# Patient Record
Sex: Female | Born: 2000 | Race: White | Hispanic: No | Marital: Single | Smoking: Never smoker
Health system: Southern US, Community
[De-identification: ages and names within clinical notes are randomized; demographics above are authoritative.]

## PROBLEM LIST (undated history)

## (undated) DIAGNOSIS — I7300 Raynaud's syndrome without gangrene: Secondary | ICD-10-CM

## (undated) DIAGNOSIS — K3184 Gastroparesis: Secondary | ICD-10-CM

## (undated) DIAGNOSIS — B019 Varicella without complication: Secondary | ICD-10-CM

## (undated) DIAGNOSIS — K219 Gastro-esophageal reflux disease without esophagitis: Secondary | ICD-10-CM

## (undated) DIAGNOSIS — F419 Anxiety disorder, unspecified: Secondary | ICD-10-CM

## (undated) DIAGNOSIS — N390 Urinary tract infection, site not specified: Secondary | ICD-10-CM

## (undated) HISTORY — DX: Raynaud's syndrome without gangrene: I73.00

## (undated) HISTORY — PX: NO PAST SURGERIES: SHX2092

## (undated) HISTORY — DX: Anxiety disorder, unspecified: F41.9

## (undated) HISTORY — DX: Gastroparesis: K31.84

## (undated) HISTORY — DX: Urinary tract infection, site not specified: N39.0

## (undated) HISTORY — DX: Varicella without complication: B01.9

## (undated) HISTORY — DX: Gastro-esophageal reflux disease without esophagitis: K21.9

---
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
## 2019-05-18 DIAGNOSIS — K3184 Gastroparesis: Secondary | ICD-10-CM | POA: Insufficient documentation

## 2019-12-18 DIAGNOSIS — J029 Acute pharyngitis, unspecified: Secondary | ICD-10-CM | POA: Diagnosis not present

## 2019-12-18 DIAGNOSIS — J039 Acute tonsillitis, unspecified: Secondary | ICD-10-CM | POA: Diagnosis not present

## 2019-12-18 DIAGNOSIS — Z03818 Encounter for observation for suspected exposure to other biological agents ruled out: Secondary | ICD-10-CM | POA: Diagnosis not present

## 2019-12-18 DIAGNOSIS — R05 Cough: Secondary | ICD-10-CM | POA: Diagnosis not present

## 2021-08-10 ENCOUNTER — Encounter: Payer: Self-pay | Admitting: Neurology

## 2021-08-10 ENCOUNTER — Ambulatory Visit (INDEPENDENT_AMBULATORY_CARE_PROVIDER_SITE_OTHER): Payer: No Typology Code available for payment source | Admitting: Neurology

## 2021-08-10 VITALS — BP 115/78 | HR 92 | Ht 64.0 in | Wt 215.0 lb

## 2021-08-10 DIAGNOSIS — Z820 Family history of epilepsy and other diseases of the nervous system: Secondary | ICD-10-CM | POA: Diagnosis not present

## 2021-08-10 DIAGNOSIS — R6889 Other general symptoms and signs: Secondary | ICD-10-CM

## 2021-08-10 DIAGNOSIS — G4719 Other hypersomnia: Secondary | ICD-10-CM

## 2021-08-10 NOTE — Progress Notes (Signed)
Subjective:  ?  ?Patient ID: Rhonda Holt is a 21 y.o. female. ? ?HPI ? ? ? ?Rhonda Foley, Rhonda Holt, Rhonda Holt ?[facility] ?[address], [address] ?[phone] ?[place], [place] [address] ? ?Dear Rhonda Holt, ? ?I saw your patient, Rhonda Holt,  upon your kind request in my sleep clinic today for initial consultation of her sleep disorder, in particular, her excessive daytime somnolence and vivid dreams.  The patient is accompanied by her mother today.  As you know, Rhonda Holt is a 21 year old right-handed woman with an underlying medical history of reflux disease, Raynaud's disease, gastroparesis, anxiety, and obesity, who reports a 4- 5 month history of vivid dreams. She has had trouble losing weight. She works as a Systems analyst. She has experienced daytime sleepiness, for the past 3 months.  She has not been taking her duloxetine and has not taken it in about 7 months.  She does not take her hydroxyzine and is currently not on Flexeril.  She takes Xanax as needed, maybe once a week.  Bedtime is generally between 10 and 11 PM and rise time around 6 AM on workdays and on the weekends she may sleep till 1 PM.  She reports a family history of sleep apnea, maternal grandmother has sleep apnea and mom has sleep apnea.  Mom also reports a diagnosis of narcolepsy. I reviewed your office note from 06/05/2021.  Her Epworth sleepiness score is 4 out of 24, fatigue severity score is 45 out of 63.  She has not fallen asleep at the wheel.  She feels tired, she has felt tired at work but has not fallen asleep at work.  She reports vivid dreams and being confused between reality and dreams.  She denies telltale symptoms of cataplexy or sleep paralysis, no hypnagogic or hypnopompic hallucinations.  She does not drink any caffeine currently.  She drinks alcohol rarely.  She smokes marijuana rarely, she does not smoke any cigarettes.  She had blood work through your office, results are not available for my review today.   She had a telemedicine visit with a sleep specialist on 06/04/2021 and I reviewed the note.  She was advised to proceed with a home sleep test.  She did not have it done.   ?She denies snoring, recurrent morning headaches or nocturia or night to night basis. ? ?Her Past Medical History Is Significant For: ?Past Medical History:  ?Diagnosis Date  ? Acid reflux   ? Anxiety   ? Gastroparesis   ? Raynaud's disease without gangrene   ? UTI (urinary tract infection)   ? Varicella   ? ? ?Her Past Surgical History Is Significant For: ?Past Surgical History:  ?Procedure Laterality Date  ? NO PAST SURGERIES    ? ? ?Her Family History Is Significant For: ?Family History  ?Problem Relation Age of Onset  ? Thyroid cancer Mother   ? Kidney cancer Mother   ? Migraines Mother   ? Supraventricular tachycardia Mother   ?     ablation  ? Sleep apnea Mother   ? Other Father   ?     allergies penicillin and sulfa  ? Diabetes Mellitus II Maternal Grandmother   ? Supraventricular tachycardia Maternal Grandmother   ?     ablation  ? Sleep apnea Maternal Grandmother   ? Diabetes Mellitus II Paternal Grandmother   ? Heart attack Paternal Grandmother   ? ? ?Her Social History Is Significant For: ?Social History  ? ?Socioeconomic History  ?  Marital status: Single  ?  Spouse name: Not on file  ? Number of children: 0  ? Years of education: 12+  ? Highest education level: High school graduate  ?Occupational History  ? Occupation: student  ?Tobacco Use  ? Smoking status: Never  ? Smokeless tobacco: Never  ?Vaping Use  ? Vaping Use: Never used  ?Substance and Sexual Activity  ? Alcohol use: Yes  ?  Comment: OCC  ? Drug use: Yes  ?  Types: Marijuana  ? Sexual activity: Not on file  ?Other Topics Concern  ? Not on file  ?Social History Narrative  ? Lives at home with mom, dad, and sister  ? Right handed  ? Caffeine: trying to avoid, once a month  ? ?Social Determinants of Health  ? ?Financial Resource Strain: Not on file  ?Food Insecurity: Not on file   ?Transportation Needs: Not on file  ?Physical Activity: Not on file  ?Stress: Not on file  ?Social Connections: Not on file  ? ? ?Her Allergies Are:  ?Allergies  ?Allergen Reactions  ? Charcoal   ?  Facial products containing charcoal. Face turns "neon red" for several hours.   ?:  ? ?Her Current Medications Are:  ?Outpatient Encounter Medications as of 08/10/2021  ?Medication Sig  ? JUNEL 1/20 1-20 MG-MCG tablet Take 1 tablet by mouth daily.   ? cyclobenzaprine (FLEXERIL) 10 MG tablet Take 1 tablet (10 mg total) by mouth 3 (three) times daily as needed for muscle spasms.  ? hydrOXYzine (ATARAX/VISTARIL) 50 MG tablet Take one 50 mg tablet at night before bed  ? promethazine (PHENERGAN) 12.5 MG tablet Take 12.5 mg by mouth every 6 (six) hours as needed for nausea.   ? ?No facility-administered encounter medications on file as of 08/10/2021.  ?: ? ? ?Review of Systems:  ?Out of a complete 14 point review of systems, all are reviewed and negative with the exception of these symptoms as listed below: ? ?Review of Systems  ?Neurological:   ?     Pt is here for sleep consult  pt states she has fatigue. Pt states that she has very vivid dreams and she wakes up confused Pt denies hypertension,snore, headaches, CPAP machine ,and sleep study  ? ?ESS:4 ?FSS:45  ? ?Objective:  ?Neurological Exam ? ?Physical Exam ?Physical Examination:  ? ?Vitals:  ? 08/10/21 1335  ?BP: 115/78  ?Pulse: 92  ? ? ?General Examination: The patient is a very pleasant 21 y.o. female in no acute distress. She appears well-developed and well-nourished and well groomed.  ? ?HEENT: Normocephalic, atraumatic, pupils are equal, round and reactive to light, extraocular tracking is good without limitation to gaze excursion or nystagmus noted. Hearing is grossly intact. Face is symmetric with normal facial animation. Speech is clear with no dysarthria noted. There is no hypophonia. There is no lip, neck/head, jaw or voice tremor. Neck is supple with full range of  passive and active motion. There are no carotid bruits on auscultation. Oropharynx exam reveals: mild mouth dryness, good dental hygiene and moderate airway crowding, due to small airway entry, tonsillar size of about 1-2+.  Mallampati class I.  Neck circumference of 14-5/8 inches.  Tongue protrudes centrally and palate elevates symmetrically. ? ?Chest: Clear to auscultation without wheezing, rhonchi or crackles noted. ? ?Heart: S1+S2+0, regular and normal without murmurs, rubs or gallops noted.  ? ?Abdomen: Soft, non-tender and non-distended with normal bowel sounds appreciated on auscultation. ? ?Extremities: There is no obvious edema.  ? ?  Skin: Warm and dry without trophic changes noted.  ? ?Musculoskeletal: exam reveals no obvious joint deformities.  ? ?Neurologically:  ?Mental status: The patient is awake, alert and oriented in all 4 spheres. Her immediate and remote memory, attention, language skills and fund of knowledge are appropriate. There is no evidence of aphasia, agnosia, apraxia or anomia. Speech is clear with normal prosody and enunciation. Thought process is linear. Mood is normal and affect is normal.  ?Cranial nerves II - XII are as described above under HEENT exam.  ?Motor exam: Normal bulk, strength and tone is noted. There is no obvious tremor. Fine motor skills and coordination: grossly intact.  ?Cerebellar testing: No dysmetria or intention tremor. There is no truncal or gait ataxia.  ?Sensory exam: intact to light touch in the upper and lower extremities.  ?Gait, station and balance: She stands easily. No veering to one side is noted. No leaning to one side is noted. Posture is age-appropriate and stance is narrow based. Gait shows normal stride length and normal pace. No problems turning are noted.  ? ?Assessment and Plan:  ?In summary, Jorden A Boyajian is a very pleasant 21 y.o.-year old female with an underlying medical history of reflux disease, Raynaud's disease, gastroparesis, anxiety,  and obesity, who who presents for evaluation of her sleep disturbance including vivid dreams and daytime somnolence of 4 to 5 months duration.  Her history is not compelling for sleep disordered breath

## 2021-08-10 NOTE — Patient Instructions (Signed)
Thank you for choosing [facility] for your sleep related care! ?It was nice to meet you today! I appreciate that you entrust me with your sleep related healthcare concerns. I hope, I was able to address at least some of your concerns today, and that I can help you feel reassured and also get better.   ? ?Here is what we discussed today and what we came up with as our plan for you:  ?  ?Based on your symptoms and your exam I believe you may have an underlying sleepiness condition. We will look into your sleepiness and vivid dreams with a nighttime sleep study, followed by a daytime nap study. In preparation for sleep study testing:  ?Do not take any narcotic pain medication and do not start any new depression or anxiety medication or stimulant.  In particular, you would have to stay off your Xanax, Flexeril, duloxetine and hydroxyzine.  You are currently not taking any of these medications daily.  Eventually, for your anxiety, you may benefit from medication management, please talk to your primary care about this issue. ?Do no drive when sleepy!  ?Please keep your sleep schedule stable and do not add any additional psychotropic medications or excess caffeine in your day to day routine, in preparation of the study.  ?4.   Our sleep lab administrative assistant will call you to schedule your sleep study. If you don't hear back from her by about 2 weeks from now, please feel free to call her at [phone]. You can leave a message with your phone number and concerns, if you get the voicemail box. She will call back as soon as possible. ?  ?The overnight sleep study will help determine whether you do or do not have OSA and how severe it is. Even, if you have mild OSA, I may want you to consider treatment with CPAP, as treatment of even borderline or mild sleep apnea can result and improvement of symptoms such as sleep disruption, daytime sleepiness, nighttime bathroom breaks, restless leg symptoms,  improvement of headache syndromes, even improved mood disorder.  ? ?Please remember, the long-term risks and ramifications of untreated moderate to severe obstructive sleep apnea are: increased Cardiovascular disease, including congestive heart failure, stroke, difficult to control hypertension, treatment resistant obesity, arrhythmias, especially irregular heartbeat commonly known as A. Fib. (atrial fibrillation); even type 2 diabetes has been linked to untreated OSA.  ? ?Sleep apnea can cause disruption of sleep and sleep deprivation in most cases, which, in turn, can cause recurrent headaches, problems with memory, mood, concentration, focus, and vigilance. Most people with untreated sleep apnea report excessive daytime sleepiness, which can affect their ability to drive. Please do not drive if you feel sleepy. Patients with sleep apnea can also develop difficulty initiating and maintaining sleep (aka insomnia).  ? ?Having sleep apnea may increase your risk for other sleep disorders, including involuntary behaviors sleep such as sleep terrors, sleep talking, sleepwalking.   ? ?Having sleep apnea can also increase your risk for restless leg syndrome and leg movements at night.  ? ?Please note that untreated obstructive sleep apnea may carry additional perioperative morbidity. Patients with significant obstructive sleep apnea (typically, in the moderate to severe degree) should receive, if possible, perioperative PAP (positive airway pressure) therapy and the surgeons and particularly the anesthesiologists should be informed of the diagnosis and the severity of the sleep disordered breathing.  ? ?I will plan to see you back after your sleep study to go over the  test results and where to go from there. We will call you after your sleep study to advise about the results (most likely, you will hear from my nurse) and to set up an appointment at the time, as necessary.   ? ?

## 2021-11-03 ENCOUNTER — Telehealth: Payer: Self-pay

## 2021-11-03 NOTE — Telephone Encounter (Signed)
LVM for pt to call back to let sleep lab know if she would like to proceed with sleep studies.   

## 2021-11-11 NOTE — Telephone Encounter (Signed)
We have attempted to call the patient two times to schedule sleep study.  Patient has been unavailable at the phone numbers we have on file and has not returned our calls. If patient calls back we will schedule them for their sleep study.  

## 2022-05-03 ENCOUNTER — Other Ambulatory Visit (HOSPITAL_COMMUNITY): Payer: Self-pay | Admitting: Gastroenterology

## 2022-05-03 DIAGNOSIS — R112 Nausea with vomiting, unspecified: Secondary | ICD-10-CM

## 2022-05-13 ENCOUNTER — Ambulatory Visit (HOSPITAL_COMMUNITY)
Admission: RE | Admit: 2022-05-13 | Discharge: 2022-05-13 | Disposition: A | Discharge status: Home / Self Care | Payer: No Typology Code available for payment source | Source: Ambulatory Visit | Attending: Gastroenterology | Admitting: Gastroenterology

## 2022-05-13 DIAGNOSIS — R112 Nausea with vomiting, unspecified: Secondary | ICD-10-CM | POA: Diagnosis not present

## 2022-05-13 MED ORDER — TECHNETIUM TC 99M SULFUR COLLOID
2.1000 | Freq: Once | INTRAVENOUS | Status: AC
  Administered 2022-05-13: 2.1 via INTRAVENOUS

## 2022-05-18 ENCOUNTER — Other Ambulatory Visit: Payer: Self-pay | Admitting: Gastroenterology

## 2022-05-18 DIAGNOSIS — R112 Nausea with vomiting, unspecified: Secondary | ICD-10-CM

## 2022-05-28 ENCOUNTER — Emergency Department (HOSPITAL_BASED_OUTPATIENT_CLINIC_OR_DEPARTMENT_OTHER)
Admission: EM | Admit: 2022-05-28 | Discharge: 2022-05-28 | Disposition: A | Discharge status: Home / Self Care | Payer: No Typology Code available for payment source | Attending: Emergency Medicine | Admitting: Emergency Medicine

## 2022-05-28 ENCOUNTER — Emergency Department (HOSPITAL_BASED_OUTPATIENT_CLINIC_OR_DEPARTMENT_OTHER): Payer: No Typology Code available for payment source

## 2022-05-28 ENCOUNTER — Other Ambulatory Visit (HOSPITAL_BASED_OUTPATIENT_CLINIC_OR_DEPARTMENT_OTHER): Payer: Self-pay

## 2022-05-28 ENCOUNTER — Encounter (HOSPITAL_BASED_OUTPATIENT_CLINIC_OR_DEPARTMENT_OTHER): Payer: Self-pay

## 2022-05-28 ENCOUNTER — Other Ambulatory Visit: Payer: Self-pay

## 2022-05-28 DIAGNOSIS — K219 Gastro-esophageal reflux disease without esophagitis: Secondary | ICD-10-CM | POA: Insufficient documentation

## 2022-05-28 DIAGNOSIS — R1031 Right lower quadrant pain: Secondary | ICD-10-CM | POA: Diagnosis not present

## 2022-05-28 DIAGNOSIS — R1011 Right upper quadrant pain: Secondary | ICD-10-CM | POA: Diagnosis not present

## 2022-05-28 DIAGNOSIS — R110 Nausea: Secondary | ICD-10-CM | POA: Insufficient documentation

## 2022-05-28 DIAGNOSIS — R109 Unspecified abdominal pain: Secondary | ICD-10-CM | POA: Diagnosis present

## 2022-05-28 DIAGNOSIS — R1033 Periumbilical pain: Secondary | ICD-10-CM | POA: Diagnosis not present

## 2022-05-28 LAB — URINALYSIS, ROUTINE W REFLEX MICROSCOPIC
Bilirubin Urine: NEGATIVE
Glucose, UA: NEGATIVE mg/dL
Ketones, ur: NEGATIVE mg/dL
Leukocytes,Ua: NEGATIVE
Nitrite: NEGATIVE
Protein, ur: NEGATIVE mg/dL
Specific Gravity, Urine: 1.02 (ref 1.005–1.030)
pH: 7 (ref 5.0–8.0)

## 2022-05-28 LAB — CBC WITH DIFFERENTIAL/PLATELET
Abs Immature Granulocytes: 0.03 10*3/uL (ref 0.00–0.07)
Basophils Absolute: 0.1 10*3/uL (ref 0.0–0.1)
Basophils Relative: 1 %
Eosinophils Absolute: 0.1 10*3/uL (ref 0.0–0.5)
Eosinophils Relative: 1 %
HCT: 39.4 % (ref 36.0–46.0)
Hemoglobin: 13.4 g/dL (ref 12.0–15.0)
Immature Granulocytes: 0 %
Lymphocytes Relative: 29 %
Lymphs Abs: 2.8 10*3/uL (ref 0.7–4.0)
MCH: 28.8 pg (ref 26.0–34.0)
MCHC: 34 g/dL (ref 30.0–36.0)
MCV: 84.5 fL (ref 80.0–100.0)
Monocytes Absolute: 0.6 10*3/uL (ref 0.1–1.0)
Monocytes Relative: 6 %
Neutro Abs: 6.2 10*3/uL (ref 1.7–7.7)
Neutrophils Relative %: 63 %
Platelets: 245 10*3/uL (ref 150–400)
RBC: 4.66 MIL/uL (ref 3.87–5.11)
RDW: 12.5 % (ref 11.5–15.5)
WBC: 9.7 10*3/uL (ref 4.0–10.5)
nRBC: 0 % (ref 0.0–0.2)

## 2022-05-28 LAB — COMPREHENSIVE METABOLIC PANEL
ALT: 12 U/L (ref 0–44)
AST: 11 U/L — ABNORMAL LOW (ref 15–41)
Albumin: 4 g/dL (ref 3.5–5.0)
Alkaline Phosphatase: 73 U/L (ref 38–126)
Anion gap: 8 (ref 5–15)
BUN: 11 mg/dL (ref 6–20)
CO2: 25 mmol/L (ref 22–32)
Calcium: 9 mg/dL (ref 8.9–10.3)
Chloride: 106 mmol/L (ref 98–111)
Creatinine, Ser: 0.69 mg/dL (ref 0.44–1.00)
GFR, Estimated: >60 mL/min (ref >60)
Glucose, Bld: 83 mg/dL (ref 70–99)
Potassium: 3.7 mmol/L (ref 3.5–5.1)
Sodium: 139 mmol/L (ref 135–145)
Total Bilirubin: 0.2 mg/dL — ABNORMAL LOW (ref 0.3–1.2)
Total Protein: 7.4 g/dL (ref 6.5–8.1)

## 2022-05-28 LAB — PREGNANCY, URINE: Preg Test, Ur: NEGATIVE

## 2022-05-28 LAB — LIPASE, BLOOD: Lipase: 26 U/L (ref 11–51)

## 2022-05-28 MED ORDER — DICYCLOMINE HCL 20 MG PO TABS: 20.0000 mg | ORAL_TABLET | Freq: Two times a day (BID) | ORAL | 0 refills | Status: DC

## 2022-05-28 MED ORDER — FENTANYL CITRATE PF 50 MCG/ML IJ SOSY
50.0000 ug | PREFILLED_SYRINGE | Freq: Once | INTRAMUSCULAR | Status: AC
  Administered 2022-05-28: 50 ug via INTRAVENOUS
  Filled 2022-05-28: qty 1

## 2022-05-28 MED ORDER — SODIUM CHLORIDE 0.9 % IV BOLUS
1000.0000 mL | Freq: Once | INTRAVENOUS | Status: AC
  Administered 2022-05-28: 1000 mL via INTRAVENOUS

## 2022-05-28 MED ORDER — IOHEXOL 300 MG/ML  SOLN
100.0000 mL | Freq: Once | INTRAMUSCULAR | Status: AC | PRN
  Administered 2022-05-28: 100 mL via INTRAVENOUS

## 2022-05-28 MED ORDER — ONDANSETRON HCL 4 MG/2ML IJ SOLN
4.0000 mg | Freq: Once | INTRAMUSCULAR | Status: AC
  Administered 2022-05-28: 4 mg via INTRAVENOUS
  Filled 2022-05-28: qty 2

## 2022-05-28 NOTE — ED Notes (Signed)
Patient transported to CT 

## 2022-05-28 NOTE — ED Provider Notes (Signed)
[facility] Provider Note   CSN: [id number] Arrival date & time: 05/28/22  1207     History  Chief Complaint  Patient presents with   Abdominal Pain    Rhonda Holt is a 22 y.o. female.   Abdominal Pain    This is a 22 year old female with medical history of GERD, anxiety, gastroparesis presenting to the emergency department due to abdominal pain.  It has been intermittent since yesterday, states that is associated with nausea, epigastric tightness which goes to the right upper quadrant, periumbilically and right lower quadrant.  Seen at urgent care prior to arrival and sent to ED for cholecystitis versus appendicitis versus nephrolithiasis.  Patient denies any dysuria, hematuria, history of kidney stones.  No previous abdominal surgeries, she has been having intermittent right upper quadrant pain which is worse anytime she eats but she was being worked up by gastroenterology for her with ultrasound scheduled for March of this year.  Normal bowel movements, no vaginal discharge or pelvic pain.  Home Medications Prior to Admission medications   Medication Sig Start Date End Date Taking? Authorizing Provider  dicyclomine (BENTYL) 20 MG tablet Take 1 tablet (20 mg total) by mouth 2 (two) times daily. 05/28/22  Yes Sherrill Raring, PA-C  cyclobenzaprine (FLEXERIL) 10 MG tablet Take 1 tablet (10 mg total) by mouth 3 (three) times daily as needed for muscle spasms. 02/13/18   Melvenia Beam, MD  hydrOXYzine (ATARAX/VISTARIL) 50 MG tablet Take one 50 mg tablet at night before bed 11/26/16   [provider]  JUNEL 1/20 1-20 MG-MCG tablet Take 1 tablet by mouth daily.  02/10/18   [provider]  promethazine (PHENERGAN) 12.5 MG tablet Take 12.5 mg by mouth every 6 (six) hours as needed for nausea.  01/20/17   [provider]      Allergies    Charcoal, Lexapro [escitalopram], and Pistachio nut (diagnostic)    Review of  Systems   Review of Systems  Gastrointestinal:  Positive for abdominal pain.    Physical Exam Updated Vital Signs BP 109/75   Pulse 83   Temp 98.3 F (36.8 C) (Oral)   Resp 20   Ht '5\' 4"'$  (1.626 m)   Wt 102.1 kg   SpO2 98%   BMI 38.62 kg/m  Physical Exam Vitals and nursing note reviewed. Exam conducted with a chaperone present.  Constitutional:      Appearance: Normal appearance.  HENT:     Head: Normocephalic and atraumatic.  Eyes:     General: No scleral icterus.       Right eye: No discharge.        Left eye: No discharge.     Extraocular Movements: Extraocular movements intact.     Pupils: Pupils are equal, round, and reactive to light.  Cardiovascular:     Rate and Rhythm: Normal rate and regular rhythm.     Pulses: Normal pulses.     Heart sounds: Normal heart sounds. No murmur heard.    No friction rub. No gallop.  Pulmonary:     Effort: Pulmonary effort is normal. No respiratory distress.     Breath sounds: Normal breath sounds.  Abdominal:     General: Abdomen is flat. Bowel sounds are normal. There is no distension.     Palpations: Abdomen is soft.     Tenderness: There is abdominal tenderness in the right upper quadrant and right lower quadrant. There is no right CVA tenderness.  Skin:    General: Skin is warm and dry.     Coloration: Skin is not jaundiced.  Neurological:     Mental Status: She is alert. Mental status is at baseline.     Coordination: Coordination normal.     ED Results / Procedures / Treatments   Labs (all labs ordered are listed, but only abnormal results are displayed) Labs Reviewed  COMPREHENSIVE METABOLIC PANEL - Abnormal; Notable for the following components:      Result Value   AST 11 (*)    Total Bilirubin 0.2 (*)    All other components within normal limits  URINALYSIS, ROUTINE W REFLEX MICROSCOPIC - Abnormal; Notable for the following components:   Hgb urine dipstick SMALL (*)    Bacteria, UA RARE (*)    All other  components within normal limits  CBC WITH DIFFERENTIAL/PLATELET  LIPASE, BLOOD  PREGNANCY, URINE    EKG None  Radiology US Abdomen Limited RUQ (LIVER/GB)  Result Date: 05/28/2022 CLINICAL DATA:  Right upper quadrant pain with nausea and vomiting for 2 days EXAM: ULTRASOUND ABDOMEN LIMITED RIGHT UPPER QUADRANT COMPARISON:  None Available. FINDINGS: Gallbladder: No gallstones or Zukowski thickening visualized. No sonographic Murphy sign noted by sonographer. Common bile duct: Diameter: 3 mm Liver: No focal lesion identified. Within normal limits in parenchymal echogenicity. Portal vein is patent on color Doppler imaging with normal direction of blood flow towards the liver. Other: None. IMPRESSION: Normal study. Electronically Signed   By: Dorise Bullion III M.D.   On: 05/28/2022 16:32   CT Abdomen Pelvis W Contrast  Result Date: 05/28/2022 CLINICAL DATA:  Right lower quadrant abdominal pain EXAM: CT ABDOMEN AND PELVIS WITH CONTRAST TECHNIQUE: Multidetector CT imaging of the abdomen and pelvis was performed using the standard protocol following bolus administration of intravenous contrast. RADIATION DOSE REDUCTION: This exam was performed according to the departmental dose-optimization program which includes automated exposure control, adjustment of the mA and/or kV according to patient size and/or use of iterative reconstruction technique. CONTRAST:  125m OMNIPAQUE IOHEXOL 300 MG/ML  SOLN COMPARISON:  Renal ultrasound September 21, 2005. FINDINGS: Lower chest: No acute abnormality. Hepatobiliary: No suspicious hepatic lesion gallbladder is unremarkable. No biliary ductal dilation. Pancreas: No pancreatic ductal dilation or evidence of acute inflammation Spleen: No splenomegaly. Adrenals/Urinary Tract: Bilateral adrenal glands appear normal. No hydronephrosis. Kidneys demonstrate symmetric enhancement. Urinary bladder is unremarkable for degree of distension. Stomach/Bowel: No radiopaque enteric contrast  material was administered. Stomach is unremarkable for degree of distension. No pathologic dilation of small or large bowel. The appendix is not confidently identified however there is no pericecal inflammation. Moderate volume of formed stool in the colon. No evidence of acute bowel inflammation. Vascular/Lymphatic: Normal caliber abdominal aorta. Smooth IVC contours. No pathologically enlarged abdominal or pelvic lymph nodes. Reproductive: Uterus and bilateral adnexa are unremarkable. Other: No significant abdominopelvic free fluid. Musculoskeletal: No acute osseous abnormality. IMPRESSION: 1. No acute abnormality in the abdomen or pelvis. 2. The appendix is not confidently identified however there is no pericecal inflammation. 3. Moderate volume of formed stool in the colon. Electronically Signed   By: JDahlia BailiffM.D.   On: 05/28/2022 15:24    Procedures Procedures    Medications Ordered in ED Medications  ondansetron (ZOFRAN) injection 4 mg (4 mg Intravenous Given 05/28/22 1258)  fentaNYL (SUBLIMAZE) injection 50 mcg (50 mcg Intravenous Given 05/28/22 1302)  sodium chloride 0.9 % bolus 1,000 mL (0 mLs Intravenous Stopped 05/28/22 1410)  iohexol (OMNIPAQUE) 300 MG/ML solution  100 mL (100 mLs Intravenous Contrast Given 05/28/22 1517)    ED Course/ Medical Decision Making/ A&P Clinical Course as of 05/28/22 1738  Fri May 28, 2022  1535 I reevaluated patient after CT abdomen/pelvis, pain improved but returning.  Given she has been having colicky pain worse postprandial I think it is reasonable to proceed with an ultrasound in case of missed cholecystitis on the CT, I discussed with patient close improvement with this plan.  If negative will have her follow-up with her GI doctor outpatient. [HS]    Clinical Course User Index [HS] Sherrill Raring, PA-C                             Medical Decision Making Amount and/or Complexity of Data Reviewed Labs: ordered. Radiology:  ordered.  Risk Prescription drug management.   This is a 22 year old female presenting to the emergency department due to right upper quadrant pain and right lower quadrant pain x 2 days.  Differential includes appendicitis, cholecystitis, ovarian torsion, TOA, nephrolithiasis, UTI, pyelonephritis, colitis, gastritis.  Patient has no GU symptoms, abdomen is nonperitoneal.  She does have a positive Murphy sign as well as right lower quadrant tenderness but no rebound.  Laboratory workup ordered and interpreted by myself.  No leukocytosis, no gross electrolyte derangement, no transaminitis, UA is negative for any signs of UTI.  Patient is not pregnant, not a ruptured ectopic.  CT abdomen and pelvis is negative for appendicitis.  Right upper quadrant ultrasound ordered to evaluate for cholecystitis, no gallstones, not consistent with cholecystitis or cholelithiasis.    Discussed the workup with the patient, will prescribe Bentyl and have her follow-up closely with her GI for continued workup in the outpatient setting.        Final Clinical Impression(s) / ED Diagnoses Final diagnoses:  Right upper quadrant abdominal pain    Rx / DC Orders ED Discharge Orders          Ordered    dicyclomine (BENTYL) 20 MG tablet  2 times daily        05/28/22 1644              Sherrill Raring, [place] 05/28/22 1738    Tegeler, Gwenyth Allegra, MD 05/28/22 [phone]

## 2022-05-28 NOTE — ED Notes (Signed)
Discharge instructions, medications and follow up care reviewed and explained. Pt verbalized understanding and had no further questions.

## 2022-05-28 NOTE — Discharge Instructions (Signed)
Take Bentyl every 12 hours as needed.  Follow-up with your GI doctor, who recommended reassuring.  Keep a symptom diary, return to the ED for new or concerning symptoms.

## 2022-05-28 NOTE — ED Triage Notes (Signed)
Patient here POV from Home.  Endorses Mid ABD Pain that radiates to Right Flank and Back that began yesterday and worsened today.  Some N/V/D. No Fevers. No Dysuria.   NAD Noted during Triage. A&Ox4. GCS 15. Ambulatory

## 2022-06-02 ENCOUNTER — Other Ambulatory Visit (HOSPITAL_COMMUNITY): Payer: Self-pay | Admitting: Physician Assistant

## 2022-06-02 DIAGNOSIS — R112 Nausea with vomiting, unspecified: Secondary | ICD-10-CM

## 2022-06-10 ENCOUNTER — Ambulatory Visit (HOSPITAL_COMMUNITY)
Admission: RE | Admit: 2022-06-10 | Discharge: 2022-06-10 | Disposition: A | Discharge status: Home / Self Care | Payer: No Typology Code available for payment source | Source: Ambulatory Visit | Attending: Physician Assistant | Admitting: Physician Assistant

## 2022-06-10 DIAGNOSIS — R112 Nausea with vomiting, unspecified: Secondary | ICD-10-CM | POA: Insufficient documentation

## 2022-06-10 MED ORDER — TECHNETIUM TC 99M MEBROFENIN IV KIT: 5.2000 | PACK | Freq: Once | INTRAVENOUS | Status: DC | PRN

## 2022-06-11 ENCOUNTER — Other Ambulatory Visit: Payer: No Typology Code available for payment source

## 2022-06-24 MED ORDER — TECHNETIUM TC 99M MEBROFENIN IV KIT
5.2000 | PACK | Freq: Once | INTRAVENOUS | Status: AC | PRN
  Administered 2022-06-10: 5.2 via INTRAVENOUS

## 2022-11-01 ENCOUNTER — Other Ambulatory Visit (HOSPITAL_COMMUNITY): Payer: Self-pay | Admitting: Internal Medicine

## 2022-11-01 DIAGNOSIS — R112 Nausea with vomiting, unspecified: Secondary | ICD-10-CM

## 2023-08-03 ENCOUNTER — Ambulatory Visit (INDEPENDENT_AMBULATORY_CARE_PROVIDER_SITE_OTHER)

## 2023-08-03 ENCOUNTER — Ambulatory Visit
Admission: RE | Admit: 2023-08-03 | Discharge: 2023-08-03 | Disposition: A | Discharge status: Home / Self Care | Source: Ambulatory Visit | Attending: Physician Assistant | Admitting: Physician Assistant

## 2023-08-03 VITALS — BP 126/88 | HR 101 | Temp 99.2°F | Resp 15

## 2023-08-03 DIAGNOSIS — R509 Fever, unspecified: Secondary | ICD-10-CM | POA: Diagnosis not present

## 2023-08-03 DIAGNOSIS — R5383 Other fatigue: Secondary | ICD-10-CM | POA: Insufficient documentation

## 2023-08-03 DIAGNOSIS — H66002 Acute suppurative otitis media without spontaneous rupture of ear drum, left ear: Secondary | ICD-10-CM | POA: Diagnosis present

## 2023-08-03 DIAGNOSIS — R0602 Shortness of breath: Secondary | ICD-10-CM | POA: Diagnosis present

## 2023-08-03 DIAGNOSIS — J029 Acute pharyngitis, unspecified: Secondary | ICD-10-CM | POA: Insufficient documentation

## 2023-08-03 LAB — RESP PANEL BY RT-PCR (FLU A&B, COVID) ARPGX2
Influenza A by PCR: NEGATIVE
Influenza B by PCR: NEGATIVE
SARS Coronavirus 2 by RT PCR: NEGATIVE

## 2023-08-03 LAB — GROUP A STREP BY PCR: Group A Strep by PCR: NOT DETECTED

## 2023-08-03 MED ORDER — PSEUDOEPH-BROMPHEN-DM 30-2-10 MG/5ML PO SYRP: 10.0000 mL | ORAL_SOLUTION | Freq: Four times a day (QID) | ORAL | 0 refills | Status: AC | PRN

## 2023-08-03 MED ORDER — AMOXICILLIN-POT CLAVULANATE 875-125 MG PO TABS: 1.0000 | ORAL_TABLET | Freq: Two times a day (BID) | ORAL | 0 refills | Status: AC

## 2023-08-03 MED ORDER — IPRATROPIUM BROMIDE 0.06 % NA SOLN: 2.0000 | Freq: Four times a day (QID) | NASAL | 0 refills | Status: DC

## 2023-08-03 NOTE — Discharge Instructions (Addendum)
-  Negative strep, COVID, and flu -Xray of chest is normal. No pneumonia -Treating you for ear infection and developing sinusitis with Augmentin. Also sent a nasal spray and cough medicine (will help with congestion) -Continue motrin/tylenol as needed for fever
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
## 2023-08-03 NOTE — ED Triage Notes (Signed)
  Fever of 102.3 at 11pm now 101.4 at 8:30am. Additional symptoms of stuffy nose, sinus pressure, headache, dizziness, and sore throat.

## 2023-11-21 ENCOUNTER — Ambulatory Visit
Admission: RE | Admit: 2023-11-21 | Discharge: 2023-11-21 | Disposition: A | Discharge status: Home / Self Care | Payer: Self-pay | Source: Ambulatory Visit

## 2023-11-21 VITALS — BP 110/80 | HR 100 | Temp 99.8°F | Resp 16 | Ht 65.0 in | Wt 234.7 lb

## 2023-11-21 DIAGNOSIS — J069 Acute upper respiratory infection, unspecified: Secondary | ICD-10-CM

## 2023-11-21 DIAGNOSIS — K5900 Constipation, unspecified: Secondary | ICD-10-CM | POA: Insufficient documentation

## 2023-11-21 DIAGNOSIS — R400 Somnolence: Secondary | ICD-10-CM | POA: Insufficient documentation

## 2023-11-21 DIAGNOSIS — E66811 Obesity, class 1: Secondary | ICD-10-CM | POA: Insufficient documentation

## 2023-11-21 DIAGNOSIS — R41840 Attention and concentration deficit: Secondary | ICD-10-CM | POA: Insufficient documentation

## 2023-11-21 DIAGNOSIS — F339 Major depressive disorder, recurrent, unspecified: Secondary | ICD-10-CM | POA: Insufficient documentation

## 2023-11-21 DIAGNOSIS — E559 Vitamin D deficiency, unspecified: Secondary | ICD-10-CM | POA: Insufficient documentation

## 2023-11-21 DIAGNOSIS — R1013 Epigastric pain: Secondary | ICD-10-CM | POA: Insufficient documentation

## 2023-11-21 DIAGNOSIS — F3181 Bipolar II disorder: Secondary | ICD-10-CM | POA: Insufficient documentation

## 2023-11-21 DIAGNOSIS — R109 Unspecified abdominal pain: Secondary | ICD-10-CM | POA: Insufficient documentation

## 2023-11-21 LAB — GROUP A STREP BY PCR: Group A Strep by PCR: NOT DETECTED

## 2023-11-21 MED ORDER — IPRATROPIUM BROMIDE 0.06 % NA SOLN: 2.0000 | Freq: Four times a day (QID) | NASAL | 12 refills | Status: AC

## 2023-11-21 MED ORDER — AEROCHAMBER MV MISC: 2 refills | Status: AC

## 2023-11-21 MED ORDER — ALBUTEROL SULFATE HFA 108 (90 BASE) MCG/ACT IN AERS: 2.0000 | INHALATION_SPRAY | RESPIRATORY_TRACT | 0 refills | Status: AC | PRN

## 2023-11-21 MED ORDER — AMOXICILLIN-POT CLAVULANATE 875-125 MG PO TABS: 1.0000 | ORAL_TABLET | Freq: Two times a day (BID) | ORAL | 0 refills | Status: AC

## 2023-11-21 NOTE — Discharge Instructions (Addendum)
 Your strep test today was negative but your exam does reveal that you have an upper respiratory tract infection.  Given that you have had symptoms for 10 days and are continuing to run fevers I do feel a trial of antibiotics is warranted.  Take the Augmentin , 875 mg twice daily with food, for the next 7 days for treatment of your upper respiratory tract infection.  Use the albuterol  inhaler, with the spacer, and take 1 to 2 puffs every 4-6 hours as needed for any shortness of breath or if you develop wheezing.  You may use over-the-counter Tylenol and/or ibuprofen Corda pack instructions as needed for any fever or pain.  If you develop any new or worsening symptoms other return for reevaluation or follow-up with your primary care provider.  Use the Atrovent  nasal spray, 2 squirts of each nostril every 6 hours, as needed for runny nose or nasal congestion.
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
## 2023-11-21 NOTE — ED Triage Notes (Addendum)
 Pt c/o sore throat,chills,sweats,runny nose & sob x10 days. Tested neg for covid 2 days ago at home. Denies any fevers. Tmax around 100. Has tried OTC meds w/o relief.
